# Patient Record
Sex: Male | Born: 1985 | Hispanic: Yes | Marital: Married | Smoking: Never smoker
Health system: Southern US, Community
[De-identification: ages and names within clinical notes are randomized; demographics above are authoritative.]

## PROBLEM LIST (undated history)

## (undated) DIAGNOSIS — N200 Calculus of kidney: Secondary | ICD-10-CM

## (undated) DIAGNOSIS — N289 Disorder of kidney and ureter, unspecified: Secondary | ICD-10-CM

## (undated) DIAGNOSIS — T7840XA Allergy, unspecified, initial encounter: Secondary | ICD-10-CM

## (undated) HISTORY — PX: OTHER SURGICAL HISTORY: SHX169

## (undated) HISTORY — DX: Allergy, unspecified, initial encounter: T78.40XA

## (undated) HISTORY — PX: COLONOSCOPY: SHX174

---

## 2012-07-26 ENCOUNTER — Ambulatory Visit (INDEPENDENT_AMBULATORY_CARE_PROVIDER_SITE_OTHER): Payer: BC Managed Care – PPO | Admitting: Internal Medicine

## 2012-07-26 ENCOUNTER — Ambulatory Visit: Payer: BC Managed Care – PPO

## 2012-07-26 VITALS — BP 137/82 | HR 66 | Temp 98.8°F | Resp 17 | Ht 67.0 in | Wt 203.0 lb

## 2012-07-26 DIAGNOSIS — R0789 Other chest pain: Secondary | ICD-10-CM

## 2012-07-26 DIAGNOSIS — R071 Chest pain on breathing: Secondary | ICD-10-CM

## 2012-07-26 MED ORDER — CYCLOBENZAPRINE HCL 10 MG PO TABS: 10.0000 mg | ORAL_TABLET | Freq: Three times a day (TID) | ORAL | Status: DC | PRN

## 2012-07-26 NOTE — Progress Notes (Signed)
Thomas Moran is a 27 y.o. male who presents to [facility] today for left chest wall pain starting 3 days ago. Patient has pain in the left side of his chest wall radiating to the mid chest. The pain is nonexertional. He notes some palpitations but denies any shortness of breath syncope or lightheadedness. He does not recall any injury or previous episodes of chest pain. He feels well otherwise with no fevers chills nausea vomiting or diarrhea.  He works as a Scientist, forensic.     PMH: Reviewed otherwise healthy History  Substance Use Topics  . Smoking status: Never Smoker   . Smokeless tobacco: Not on file  . Alcohol Use: No   ROS as above  Medications reviewed. Current Outpatient Prescriptions  Medication Sig Dispense Refill  . guaiFENesin (MUCINEX) 600 MG 12 hr tablet Take 1,200 mg by mouth 2 (two) times daily.      . Loratadine (CLARITIN) 10 MG CAPS Take by mouth.       No current facility-administered medications for this visit.    Exam:  BP 137/82  Pulse 66  Temp(Src) 98.8 F (37.1 C) (Oral)  Resp 17  Ht 5\' 7"  (1.702 m)  Wt 203 lb (92.08 kg)  BMI 31.79 kg/m2  SpO2 99% Gen: Well NAD HEENT: EOMI,  MMM Lungs: CTABL Nl WOB Chest wall: Mildly tender insertion of the pectoralis onto the left humerus.  Nontender over chest wall.   Heart: RRR no MRG Abd: NABS, NT, ND Exts: Non edematous BL  LE, warm and well perfused.  Left shoulder: Normal range of motion normal strength negative impingement testing  12-lead EKG: Normal sinus rhythm. No ST segment abnormality.  Two-view chest x-ray: Normal-appearing no acute findings No results found for this or any previous visit (from the past 72 hour(s)).  Assessment and Plan: 27 y.o. male with left chest wall pain. 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  EKG and chest x-ray are within normal limits as are vital signs.  Plan: Flexeril, NSAIDs, Tylenol as needed Followup in 2 weeks if not improved Followup sooner if worsening Discussed warning  signs or symptoms. Please see discharge instructions. Patient expresses understanding.

## 2012-07-26 NOTE — Patient Instructions (Addendum)
Thank you for coming in today. I think you have a muscle injury to your chest.  Take the flexeril at night,  Take tylenol and aleve for pain.  Come back in 2 weeks if not better.  If worse come back sooner.  Call or go to the emergency room if you get worse, have trouble breathing, have chest pains, or palpitations.

## 2014-11-12 ENCOUNTER — Emergency Department (HOSPITAL_COMMUNITY): Payer: BLUE CROSS/BLUE SHIELD

## 2014-11-12 ENCOUNTER — Emergency Department (HOSPITAL_COMMUNITY)
Admission: EM | Admit: 2014-11-12 | Discharge: 2014-11-12 | Disposition: A | Discharge status: Home / Self Care | Payer: BLUE CROSS/BLUE SHIELD | Attending: Emergency Medicine | Admitting: Emergency Medicine

## 2014-11-12 ENCOUNTER — Encounter (HOSPITAL_COMMUNITY): Payer: Self-pay | Admitting: *Deleted

## 2014-11-12 DIAGNOSIS — Z79899 Other long term (current) drug therapy: Secondary | ICD-10-CM | POA: Insufficient documentation

## 2014-11-12 DIAGNOSIS — N201 Calculus of ureter: Secondary | ICD-10-CM | POA: Diagnosis not present

## 2014-11-12 DIAGNOSIS — R1011 Right upper quadrant pain: Secondary | ICD-10-CM | POA: Diagnosis present

## 2014-11-12 DIAGNOSIS — R6883 Chills (without fever): Secondary | ICD-10-CM | POA: Insufficient documentation

## 2014-11-12 LAB — URINE MICROSCOPIC-ADD ON

## 2014-11-12 LAB — CBC WITH DIFFERENTIAL/PLATELET
BASOS PCT: 1 % (ref 0–1)
Basophils Absolute: 0.1 10*3/uL (ref 0.0–0.1)
Eosinophils Absolute: 0.3 10*3/uL (ref 0.0–0.7)
Eosinophils Relative: 5 % (ref 0–5)
HCT: 41.2 % (ref 39.0–52.0)
HEMOGLOBIN: 13.9 g/dL (ref 13.0–17.0)
LYMPHS PCT: 40 % (ref 12–46)
Lymphs Abs: 2.1 10*3/uL (ref 0.7–4.0)
MCH: 28.7 pg (ref 26.0–34.0)
MCHC: 33.7 g/dL (ref 30.0–36.0)
MCV: 85.1 fL (ref 78.0–100.0)
MONO ABS: 0.4 10*3/uL (ref 0.1–1.0)
Monocytes Relative: 7 % (ref 3–12)
Neutro Abs: 2.5 10*3/uL (ref 1.7–7.7)
Neutrophils Relative %: 47 % (ref 43–77)
Platelets: 231 10*3/uL (ref 150–400)
RBC: 4.84 MIL/uL (ref 4.22–5.81)
RDW: 13.4 % (ref 11.5–15.5)
WBC: 5.3 10*3/uL (ref 4.0–10.5)

## 2014-11-12 LAB — URINALYSIS, ROUTINE W REFLEX MICROSCOPIC
BILIRUBIN URINE: NEGATIVE
Glucose, UA: NEGATIVE mg/dL
Ketones, ur: NEGATIVE mg/dL
Leukocytes, UA: NEGATIVE
Nitrite: NEGATIVE
PROTEIN: NEGATIVE mg/dL
Specific Gravity, Urine: 1.022 (ref 1.005–1.030)
Urobilinogen, UA: 0.2 mg/dL (ref 0.0–1.0)
pH: 6.5 (ref 5.0–8.0)

## 2014-11-12 LAB — COMPREHENSIVE METABOLIC PANEL
ALT: 132 U/L — AB (ref 17–63)
AST: 68 U/L — AB (ref 15–41)
Albumin: 4 g/dL (ref 3.5–5.0)
Alkaline Phosphatase: 77 U/L (ref 38–126)
Anion gap: 9 (ref 5–15)
BUN: 11 mg/dL (ref 6–20)
CALCIUM: 8.7 mg/dL — AB (ref 8.9–10.3)
CO2: 22 mmol/L (ref 22–32)
Chloride: 109 mmol/L (ref 101–111)
Creatinine, Ser: 0.86 mg/dL (ref 0.61–1.24)
GFR calc non Af Amer: >60 mL/min (ref >60)
Glucose, Bld: 121 mg/dL — ABNORMAL HIGH (ref 65–99)
Potassium: 4 mmol/L (ref 3.5–5.1)
SODIUM: 140 mmol/L (ref 135–145)
Total Bilirubin: 0.5 mg/dL (ref 0.3–1.2)
Total Protein: 6.9 g/dL (ref 6.5–8.1)

## 2014-11-12 LAB — LIPASE, BLOOD: Lipase: 26 U/L (ref 22–51)

## 2014-11-12 MED ORDER — SODIUM CHLORIDE 0.9 % IV BOLUS (SEPSIS)
500.0000 mL | Freq: Once | INTRAVENOUS | Status: AC
  Administered 2014-11-12: 500 mL via INTRAVENOUS

## 2014-11-12 MED ORDER — IOHEXOL 300 MG/ML  SOLN: 25.0000 mL | INTRAMUSCULAR | Status: DC

## 2014-11-12 MED ORDER — ONDANSETRON HCL 4 MG/2ML IJ SOLN
4.0000 mg | Freq: Once | INTRAMUSCULAR | Status: AC
  Administered 2014-11-12: 4 mg via INTRAVENOUS
  Filled 2014-11-12: qty 2

## 2014-11-12 MED ORDER — KETOROLAC TROMETHAMINE 30 MG/ML IJ SOLN
30.0000 mg | Freq: Once | INTRAMUSCULAR | Status: AC
  Administered 2014-11-12: 30 mg via INTRAVENOUS
  Filled 2014-11-12: qty 1

## 2014-11-12 MED ORDER — IOHEXOL 300 MG/ML  SOLN
25.0000 mL | INTRAMUSCULAR | Status: AC
  Administered 2014-11-12: 25 mL via ORAL

## 2014-11-12 MED ORDER — FENTANYL CITRATE (PF) 100 MCG/2ML IJ SOLN
100.0000 ug | Freq: Once | INTRAMUSCULAR | Status: AC
  Administered 2014-11-12: 100 ug via INTRAVENOUS
  Filled 2014-11-12: qty 2

## 2014-11-12 MED ORDER — OXYCODONE-ACETAMINOPHEN 5-325 MG PO TABS: 1.0000 | ORAL_TABLET | Freq: Four times a day (QID) | ORAL | Status: DC | PRN

## 2014-11-12 MED ORDER — IOHEXOL 300 MG/ML  SOLN
100.0000 mL | Freq: Once | INTRAMUSCULAR | Status: AC | PRN
Start: 2014-11-12 — End: 2014-11-12
  Administered 2014-11-12: 100 mL via INTRAVENOUS

## 2014-11-12 NOTE — Discharge Instructions (Signed)
Clculos renales (Kidney Stones) Los clculos renales (urolitiasis) son masas slidas que se forman en el interior de los riones. El dolor intenso es causado por el movimiento de la piedra a travs del tracto urinario. Cuando la piedra se mueve, el urter hace un espasmo alrededor de la misma. El clculo generalmente se elimina con la orina.  CAUSAS   Un trastorno que hace que ciertas glndulas del cuello produzcan demasiada hormona paratiroidea (hiperparatiroidismo primario).  Una acumulacin de cristales de cido rico, similar a la gota en las articulaciones.  Estrechamiento (constriccin) del urter.  Obstruccin en el rin presente al nacer (obstruccin congnita).  Cirugas previas del rin o los urteres.  Numerosas infecciones renales. SNTOMAS   Ganas de vomitar (nuseas).  Devolver la comida (vomitar).  Sangre en la orina (hematuria).  Dolor que generalmente se expande (irradia) hacia la ingle.  Ganas de orinar con frecuencia o de manera urgente. DIAGNSTICO   Historia clnica y examen fsico.  Anlisis de sangre y orina.  Tomografa computada.  En algunos casos se realiza un examen del interior de la vejiga (citoscopa). TRATAMIENTO   Observacin.  Aumentar la ingesta de lquidos.  Litotricia extracorprea con ondas de choque: es un procedimiento no invasivo que utiliza ondas de choque para romper los clculos renales.  Ser necesaria la ciruga si tiene dolor muy intenso o la obstruccin persiste. Hay varios procedimientos quirrgicos. La mayora de los procedimientos se realizan con el uso de pequeos instrumentos. Slo es necesario realizar pequeas incisiones para acomodar estos instrumentos, por lo tanto el tiempo de recuperacin es mnimo. El tamao, la ubicacin y la composicin qumica de los clculos son variables importantes que determinarn la eleccin correcta de tratamiento para su caso. Comunquese con su mdico para comprender mejor su  situacin, de modo que pueda minimizar los riesgos de lesiones para usted y su rin.  INSTRUCCIONES PARA EL CUIDADO EN EL HOGAR   Beba gran cantidad de lquido para mantener la orina de tono claro o color amarillo plido. Esto ayudar a eliminar las piedras o los fragmentos.  Cuele la orina con el colador que le han provisto. Guarde todas las partculas y piedras para que las vea el profesional que lo asiste. Puede ser tan pequea como un grano de sal. Es muy importante usar el colador cada vez que orine. La recoleccin de piedras permitir al mdico analizar y verificar que efectivamente ha eliminado una piedra. El anlisis de la piedra con frecuencia permitir identificar qu puede hacer para reducir la incidencia de las recurrencias.  Slo tome medicamentos de venta libre o recetados para calmar el dolor, el malestar o bajar la fiebre, segn las indicaciones de su mdico.  Cumpla con las citas de seguimiento tal como le indic el profesional que lo asiste.  Si se lo indica, hgase radiografas. La ausencia de dolor no siempre significa que las piedras se han eliminado. Puede ser que simplemente hayan dejado de moverse. Si el paso de orina permanece completamente obstruido, puede causar prdida de la funcin renal o simplemente la destruccin del rin. Es su responsabilidad completar el seguimiento y las radiografas. Las ecografas del rin pueden mostrar una obstruccin y el estado del rin. Las ecografas no se asocian con la radiacin y pueden realizarse fcilmente en cuestin de minutos. SOLICITE ATENCIN MDICA SI:  Siente dolor que no responde a los analgsicos que le recetaron. SOLICITE ATENCIN MDICA DE INMEDIATO SI:   No puede controlar el dolor con los medicamentos que le han recetado.  Siente escalofros   o fiebre.  La gravedad o la intensidad del dolor aumenta durante 18 horas y no se alivia con los analgsicos.  Presenta un nuevo episodio de dolor abdominal.  Sufre mareos  o se desmaya.  No puede orinar. ASEGRESE DE QUE:   Comprende estas instrucciones.  Controlar su afeccin.  Recibir ayuda de inmediato si no mejora o si empeora. Document Released: 05/18/2005 Document Revised: 01/18/2013 [employer] Patient Information 2015 [employer]. This information is not intended to replace advice given to you by your health care provider. Make sure you discuss any questions you have with your health care provider.  

## 2014-11-12 NOTE — ED Notes (Addendum)
Pt states that he has rt upper quadrant pain this morning. Pt reports feeling like it was "gas" then began hurting worse. Pt reports nausea as well.

## 2014-11-12 NOTE — ED Provider Notes (Signed)
CSN: [id number]     Arrival date & time 11/12/14  0708 History   First MD Initiated Contact with Patient 11/12/14 [phone]     Chief Complaint  Patient presents with  . Abdominal Pain     (Consider location/radiation/quality/duration/timing/severity/associated sxs/prior Treatment) Patient is a 29 y.o. male presenting with abdominal pain. The history is provided by the patient.  Abdominal Pain Pain location:  RUQ, R flank and RLQ Associated symptoms: chills and nausea   Associated symptoms: no chest pain, no diarrhea, no shortness of breath and no vomiting    patient with right-sided abdominal pain. Began this morning. It is severe. He has had bowel movements twice. States they were normal. Has had some nausea. He states it was due to the pain. No dysuria. No fevers but states he has had some chills. Has not had pain like this before. The pain is constant and somewhat crampy. Pain is dull. He does not smoke. No rash. No trauma.  Past Medical History  Diagnosis Date  . Allergy    History reviewed. No pertinent past surgical history. No family history on file. History  Substance Use Topics  . Smoking status: Never Smoker   . Smokeless tobacco: Not on file  . Alcohol Use: No    Review of Systems  Constitutional: Positive for chills. Negative for activity change and appetite change.  Eyes: Negative for pain.  Respiratory: Negative for chest tightness and shortness of breath.   Cardiovascular: Negative for chest pain and leg swelling.  Gastrointestinal: Positive for nausea and abdominal pain. Negative for vomiting and diarrhea.  Genitourinary: Negative for flank pain.  Musculoskeletal: Negative for back pain and neck stiffness.  Skin: Negative for rash.  Neurological: Negative for weakness, numbness and headaches.  Psychiatric/Behavioral: Negative for behavioral problems.      Allergies  Review of patient's allergies indicates no known allergies.  Home Medications   Prior to  Admission medications   Medication Sig Start Date End Date Taking? Authorizing Provider  cetirizine (ZYRTEC) 10 MG tablet Take 10 mg by mouth daily.   Yes Historical Provider, MD  oxymetazoline (AFRIN) 0.05 % nasal spray Place 1 spray into both nostrils 2 (two) times daily.   Yes Historical Provider, MD  oxyCODONE-acetaminophen (PERCOCET/ROXICET) 5-325 MG per tablet Take 1-2 tablets by mouth every 6 (six) hours as needed. 11/12/14   Benjiman Core, MD   BP 113/51 mmHg  Pulse 54  Temp(Src) 97.7 F (36.5 C) (Oral)  Resp 15  Ht  (1.626 m)  Wt 210 lb (95.255 kg)  BMI 36.03 kg/m2  SpO2 99% Physical Exam  Constitutional: He appears well-developed.  Patient appears uncomfortable  Cardiovascular: Normal rate and regular rhythm.   Pulmonary/Chest: Effort normal. He exhibits no tenderness.  Abdominal: There is tenderness.  Right upper quadrant And right flank tenderness. No hernias. No masses. No guarding.  Genitourinary:  No CVA tenderness  Musculoskeletal: Normal range of motion.  Neurological: He is alert.  Skin: Skin is warm.    ED Course  Procedures (including critical care time) Labs Review Labs Reviewed  COMPREHENSIVE METABOLIC PANEL - Abnormal; Notable for the following:    Glucose, Bld 121 (*)    Calcium 8.7 (*)    AST 68 (*)    ALT 132 (*)    All other components within normal limits  URINALYSIS, ROUTINE W REFLEX MICROSCOPIC (NOT AT [facility]) - Abnormal; Notable for the following:    Hgb urine dipstick TRACE (*)    All other components  within normal limits  CBC WITH DIFFERENTIAL/PLATELET  LIPASE, BLOOD  URINE MICROSCOPIC-ADD ON    Imaging Review No results found.   EKG Interpretation   Date/Time:  Monday November 12 2014 07:24:09 EDT Ventricular Rate:  70 PR Interval:  162 QRS Duration: 86 QT Interval:  407 QTC Calculation: 439 R Axis:   45 Text Interpretation:  Sinus arrhythmia ED PHYSICIAN INTERPRETATION  AVAILABLE IN [facility] Confirmed by TEST,  Record ([id number]) on  11/13/2014 7:24:15 AM      MDM   Final diagnoses:  Ureteral stone    Patient with right-sided abdominal pain. Tender on right abdomen. No urinary symptoms but has had bowel symptoms. CT scan shows ureteral stone. No infection. Will discharge home. Pain has been controlled    Benjiman Core, MD 11/15/14 [phone]

## 2015-12-07 IMAGING — CT CT ABD-PELV W/ CM
2 of 4 series · 16 of 46 positions shown, 18 images · IV contrast (APPLIED)
Comparison: None.

CLINICAL DATA: Right-sided abdominal pain.  Nausea

EXAM:
CT ABDOMEN AND PELVIS WITH CONTRAST
TECHNIQUE: Multidetector CT imaging of the abdomen and pelvis was performed
using the standard protocol following bolus administration of
intravenous contrast.
CONTRAST:  100mL OMNIPAQUE IOHEXOL 300 MG/ML  SOLN

[Series 2: abd/ pelvis 5.0 i30f 1 · axial · 0.79mm/px · z∈[+723,+1178]mm · 13 of 101 slices shown, 15 images]
[im 5/101  soft-tissue]
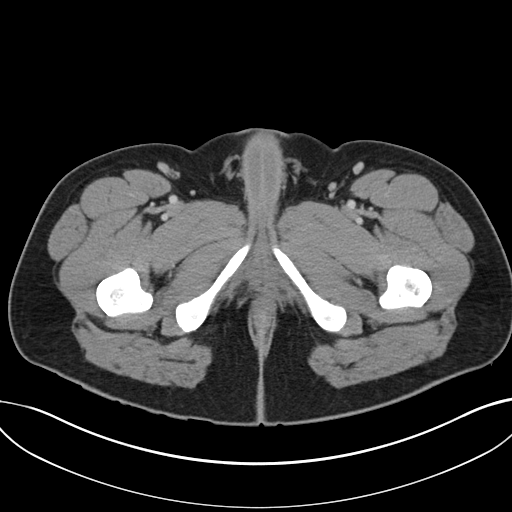
[im 5/101  bone]
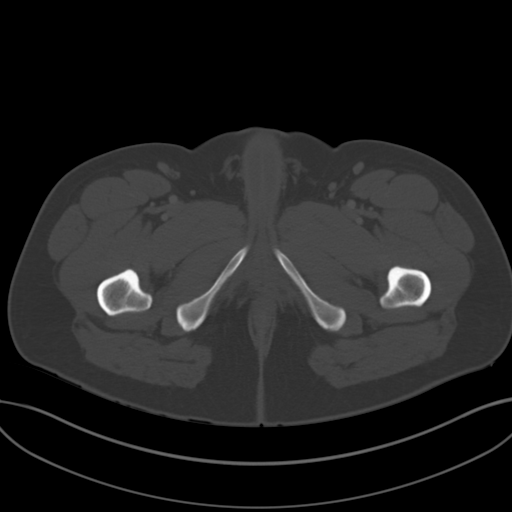
[im 13/101  soft-tissue]
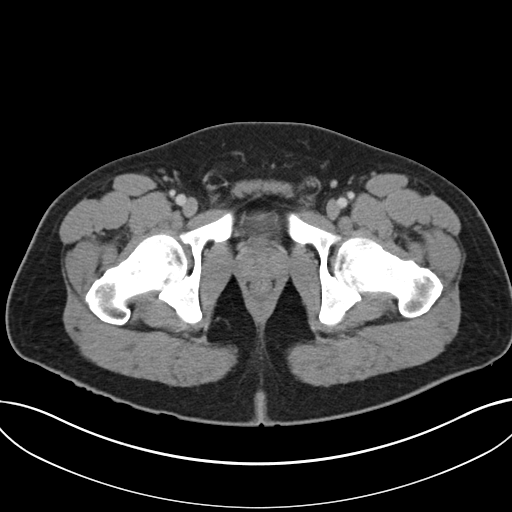
[im 21/101  soft-tissue]
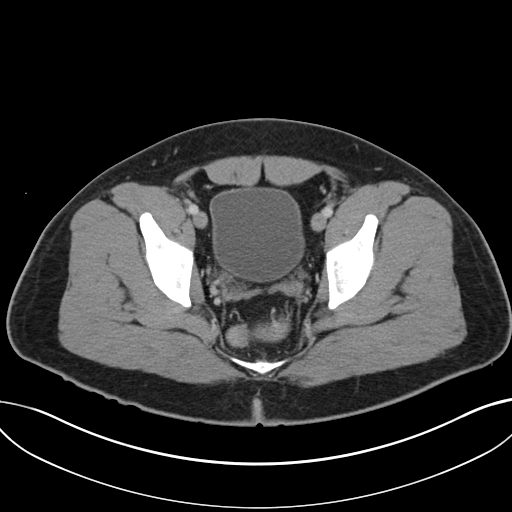
[im 30/101  soft-tissue]
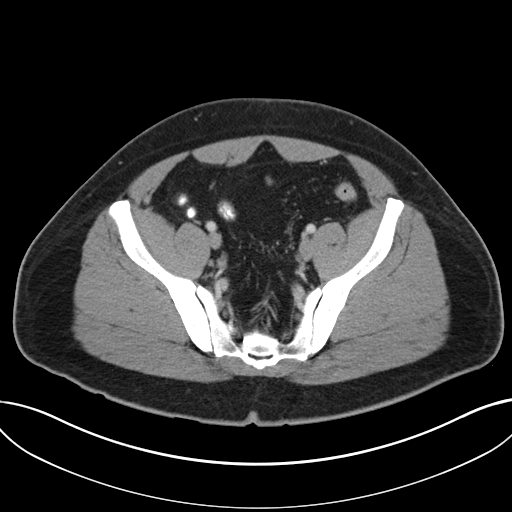
[im 34/101  soft-tissue]
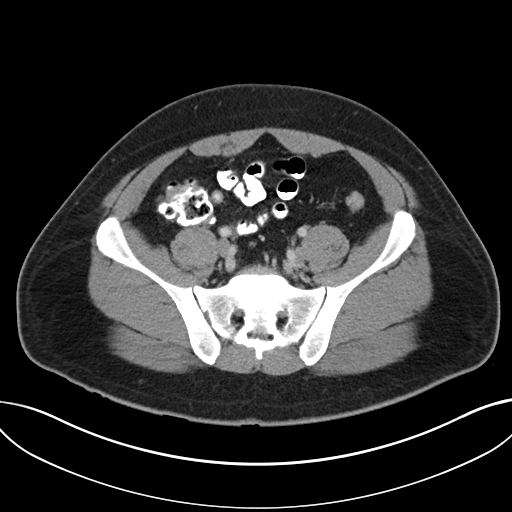
[im 42/101  soft-tissue]
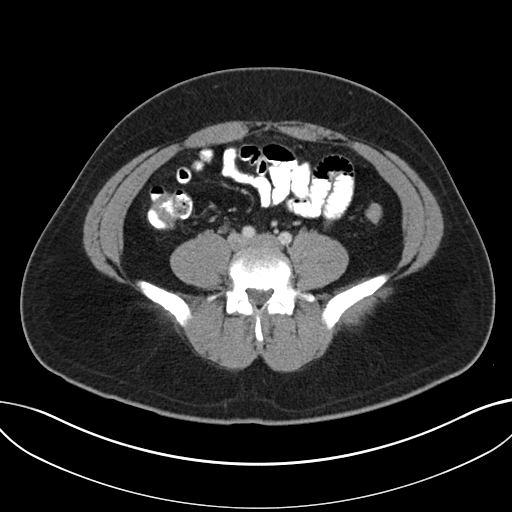
[im 51/101  soft-tissue]
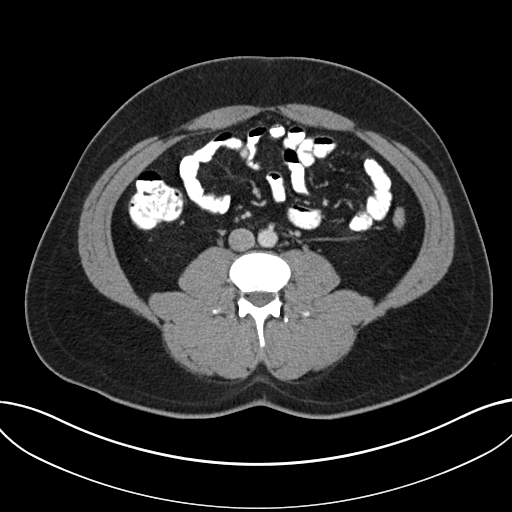
[im 59/101  soft-tissue]
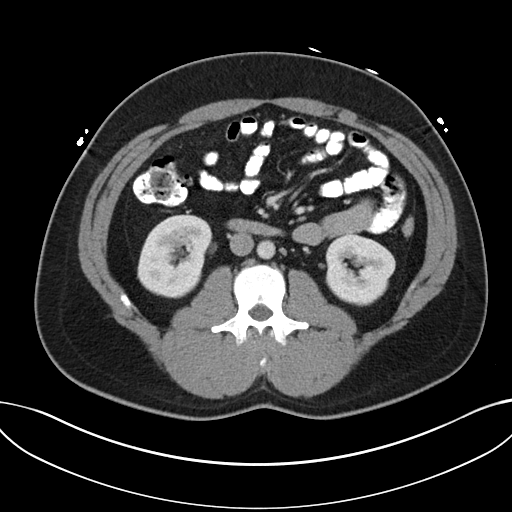
[im 67/101  soft-tissue]
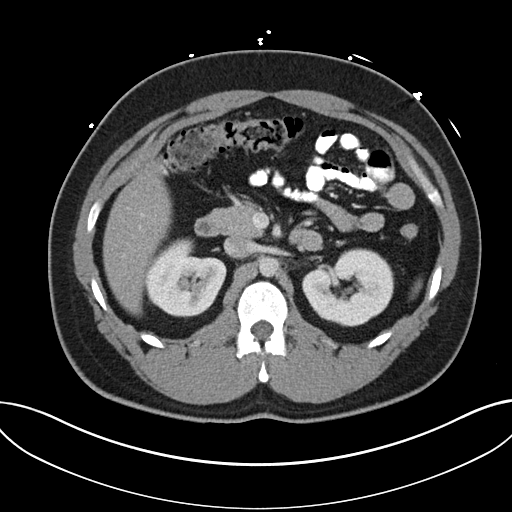
[im 67/101  bone]
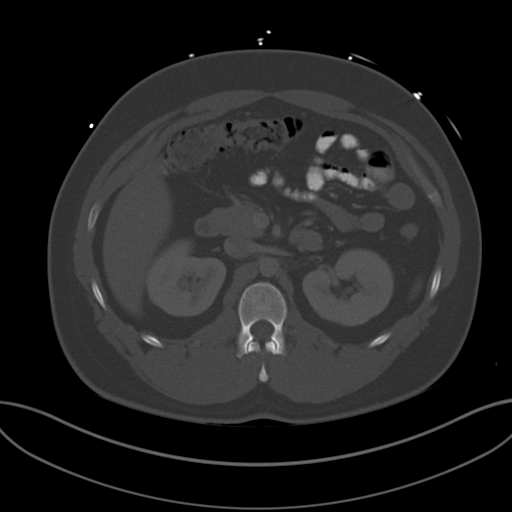
[im 71/101  soft-tissue]
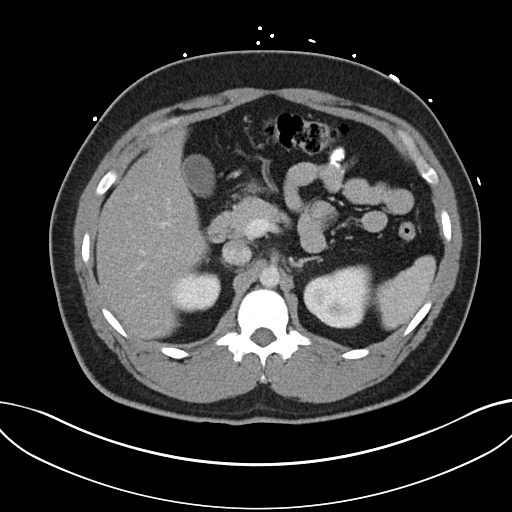
[im 80/101  soft-tissue]
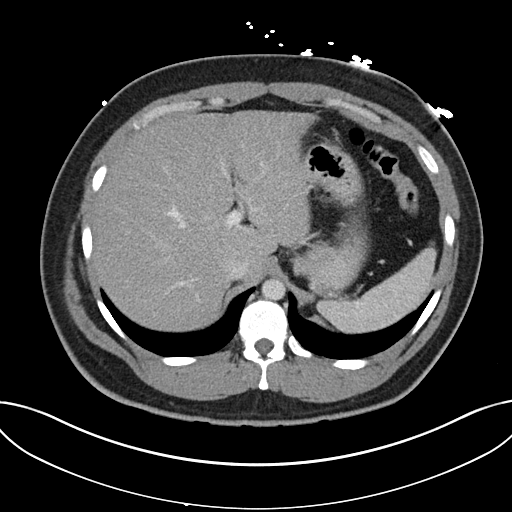
[im 88/101  soft-tissue]
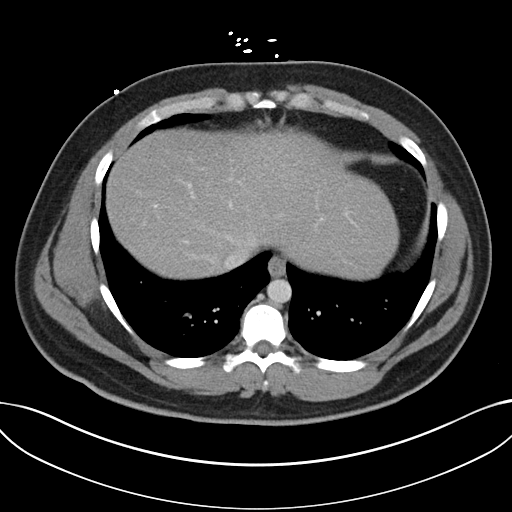
[im 96/101  soft-tissue]
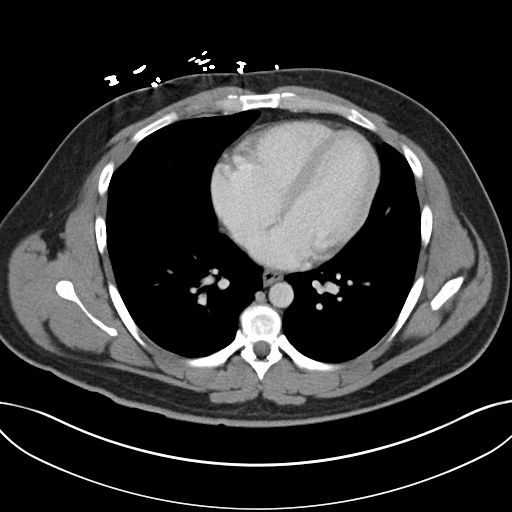

[Series 5: coronal soft tissue · coronal · 0.89mm/px · 3 of 101 slices shown]
[im 34/101  soft-tissue]
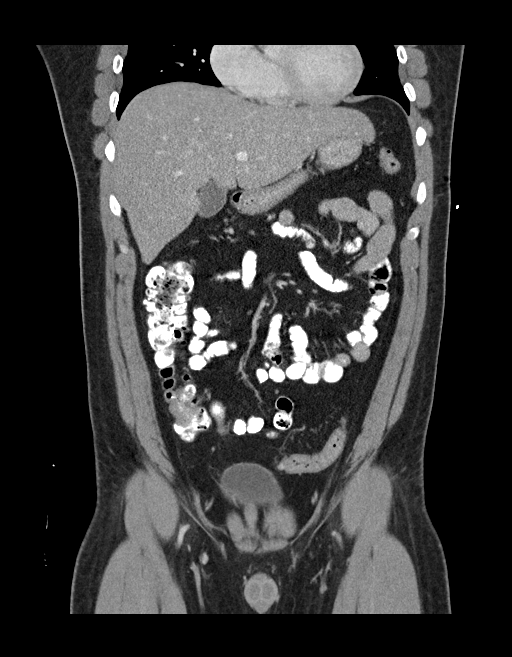
[im 45/101  soft-tissue]
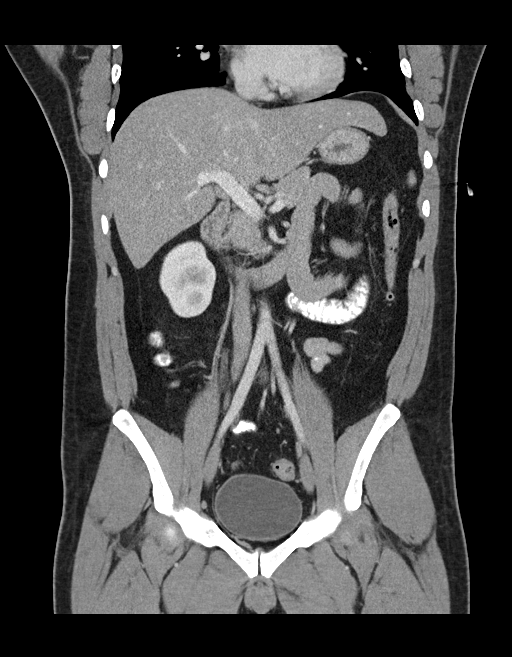
[im 56/101  soft-tissue]
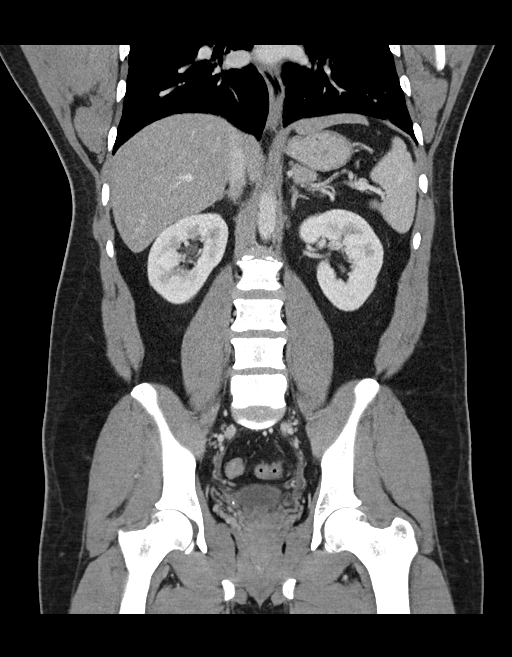

[16 of 46 positions shown; findings below may reference images not displayed]

FINDINGS: BODY WALL: No contributory findings.

LOWER CHEST: No contributory findings. Calcified granuloma noted in
the left lower lobe.

ABDOMEN/PELVIS:

Liver: No focal abnormality.  Possible hepatic steatosis.

Biliary: No evidence of biliary obstruction or stone.

Pancreas: Unremarkable.

Spleen: Unremarkable.

Adrenals: Unremarkable.

Kidneys and ureters: 2 mm stone at the right ureteral vesicular
junction with minimal hydroureter. No additional urolithiasis.

Bladder: Unremarkable.

Reproductive: No pathologic findings.

Bowel: No obstruction. No appendicitis. Colonic diverticulosis,
extensive for age in the sigmoid region.

Retroperitoneum: No mass or adenopathy.

Peritoneum: No ascites or pneumoperitoneum.

Vascular: No acute abnormality.

OSSEOUS: No acute abnormalities.
IMPRESSION: 1. 2 mm stone at the right ureteral vesicular junction,
nonobstructive.
2. Extensive colonic diverticulosis.

## 2023-09-06 ENCOUNTER — Encounter (HOSPITAL_COMMUNITY): Payer: Self-pay

## 2023-09-06 ENCOUNTER — Ambulatory Visit (HOSPITAL_COMMUNITY)
Admission: EM | Admit: 2023-09-06 | Discharge: 2023-09-06 | Disposition: A | Discharge status: Home / Self Care | Attending: Physician Assistant | Admitting: Physician Assistant

## 2023-09-06 DIAGNOSIS — R300 Dysuria: Secondary | ICD-10-CM

## 2023-09-06 LAB — POCT URINALYSIS DIP (MANUAL ENTRY)
Bilirubin, UA: NEGATIVE
Glucose, UA: NEGATIVE mg/dL
Ketones, POC UA: NEGATIVE mg/dL
Nitrite, UA: NEGATIVE
Spec Grav, UA: 1.015
Urobilinogen, UA: 0.2 U/dL
pH, UA: 6

## 2023-09-06 MED ORDER — DOXYCYCLINE HYCLATE 100 MG PO CAPS: 100.0000 mg | ORAL_CAPSULE | Freq: Two times a day (BID) | ORAL | 0 refills | Status: DC

## 2023-09-06 NOTE — ED Triage Notes (Signed)
 Patient reports that he has had dysuria x 3 1/2 weeks. Patient states he took Azo which helped some, but states that when he urinates he has bubbling from the penis.  Patient denies any penile discharge.

## 2023-09-06 NOTE — Discharge Instructions (Addendum)
 Return if any problems.

## 2023-09-06 NOTE — ED Provider Notes (Signed)
 [facility]    CSN: [id number] Arrival date & time: 09/06/23  4132      History   Chief Complaint Chief Complaint  Patient presents with   Dysuria    HPI Thomas Moran is a 38 y.o. male.   Patient complains of discomfort when he urinates.  Patient reports that he has a stabbing pain.  Patient reports he is also noticed bubbles in his urine.  Patient denies any fever or chills he has not had any abdominal pain.  Patient denies any penile discharge he denies any risk of anything sexually transmitted.  He reports his partner has not had any symptoms.  Patient denies any nausea or vomiting.  Patient denies any rash or lesions.  The history is provided by the patient. No language interpreter was used.  Dysuria Presenting symptoms: dysuria     Past Medical History:  Diagnosis Date   Allergy     Patient Active Problem List   Diagnosis Date Noted   Chest wall pain 07/26/2012    Past Surgical History:  Procedure Laterality Date   arm surgery Right    COLONOSCOPY         Home Medications    Prior to Admission medications   Medication Sig Start Date End Date Taking? Authorizing Provider  cetirizine (ZYRTEC) 10 MG tablet Take 10 mg by mouth daily.    [provider]  oxyCODONE-acetaminophen (PERCOCET/ROXICET) 5-325 MG per tablet Take 1-2 tablets by mouth every 6 (six) hours as needed. 11/12/14   Benjiman Core, MD  oxymetazoline (AFRIN) 0.05 % nasal spray Place 1 spray into both nostrils 2 (two) times daily.    [provider]    Family History Family History  Problem Relation Age of Onset   Diabetes Mother     Social History Social History   Tobacco Use   Smoking status: Never  Vaping Use   Vaping status: Never Used  Substance Use Topics   Alcohol use: Yes   Drug use: No     Allergies   Patient has no known allergies.   Review of Systems Review of Systems  Genitourinary:  Positive for dysuria.  All other systems  reviewed and are negative.    Physical Exam Triage Vital Signs ED Triage Vitals [09/06/23 0850]  Encounter Vitals Group     BP (!) 154/102     Systolic BP Percentile      Diastolic BP Percentile      Pulse Rate 93     Resp 16     Temp 98 F (36.7 C)     Temp Source Oral     SpO2 94 %     Weight      Height      Head Circumference      Peak Flow      Pain Score 4     Pain Loc      Pain Education      Exclude from Growth Chart    No data found.  Updated Vital Signs BP (!) 154/102 (BP Location: Left Arm)   Pulse 93   Temp 98 F (36.7 C) (Oral)   Resp 16   SpO2 94%   Visual Acuity Right Eye Distance:   Left Eye Distance:   Bilateral Distance:    Right Eye Near:   Left Eye Near:    Bilateral Near:     Physical Exam Vitals and nursing note reviewed.  Constitutional:      Appearance:  He is well-developed.  HENT:     Head: Normocephalic.  Pulmonary:     Effort: Pulmonary effort is normal.  Abdominal:     General: There is no distension.  Musculoskeletal:        General: Normal range of motion.     Cervical back: Normal range of motion.  Neurological:     Mental Status: He is alert and oriented to person, place, and time.      UC Treatments / Results  Labs (all labs ordered are listed, but only abnormal results are displayed) Labs Reviewed  POCT URINALYSIS DIP (MANUAL ENTRY) - Abnormal; Notable for the following components:      Result Value   Blood, UA small (*)    Protein Ur, POC trace (*)    Leukocytes, UA Small (1+) (*)    All other components within normal limits  CYTOLOGY, (ORAL, ANAL, URETHRAL) ANCILLARY ONLY    EKG   Radiology No results found.  Procedures Procedures (including critical care time)  Medications Ordered in UC Medications - No data to display  Initial Impression / Assessment and Plan / UC Course  I have reviewed the triage vital signs and the nursing notes.  Pertinent labs & imaging results that were available  during my care of the patient were reviewed by me and considered in my medical decision making (see chart for details).     UA shows small red blood cells trace of protein small leukocytes.  I will treat the patient with prescription for doxycycline.  Culture for gonorrhea chlamydia and trichomonas are pending patient is discharged in stable condition to return if any problems. Final Clinical Impressions(s) / UC Diagnoses   Final diagnoses:  Dysuria     Discharge Instructions      Return if any problems.      ED Prescriptions   None    PDMP not reviewed this encounter. An After Visit Summary was printed and given to the patient.       Elson Areas, [place] 09/06/23 [phone]

## 2023-09-07 LAB — CYTOLOGY, (ORAL, ANAL, URETHRAL) ANCILLARY ONLY
Chlamydia: NEGATIVE
Comment: NEGATIVE
Comment: NEGATIVE
Comment: NORMAL
Neisseria Gonorrhea: NEGATIVE
Trichomonas: NEGATIVE

## 2024-02-06 ENCOUNTER — Ambulatory Visit (HOSPITAL_COMMUNITY): Payer: Self-pay | Admitting: Physician Assistant

## 2024-02-06 ENCOUNTER — Ambulatory Visit (INDEPENDENT_AMBULATORY_CARE_PROVIDER_SITE_OTHER)

## 2024-02-06 ENCOUNTER — Encounter (HOSPITAL_COMMUNITY): Payer: Self-pay

## 2024-02-06 ENCOUNTER — Ambulatory Visit (HOSPITAL_COMMUNITY)
Admission: EM | Admit: 2024-02-06 | Discharge: 2024-02-06 | Disposition: A | Discharge status: Home / Self Care | Attending: Physician Assistant | Admitting: Physician Assistant

## 2024-02-06 DIAGNOSIS — R1032 Left lower quadrant pain: Secondary | ICD-10-CM | POA: Diagnosis not present

## 2024-02-06 DIAGNOSIS — R109 Unspecified abdominal pain: Secondary | ICD-10-CM | POA: Diagnosis not present

## 2024-02-06 DIAGNOSIS — B370 Candidal stomatitis: Secondary | ICD-10-CM | POA: Diagnosis present

## 2024-02-06 DIAGNOSIS — N3001 Acute cystitis with hematuria: Secondary | ICD-10-CM | POA: Diagnosis present

## 2024-02-06 HISTORY — DX: Calculus of kidney: N20.0

## 2024-02-06 HISTORY — DX: Disorder of kidney and ureter, unspecified: N28.9

## 2024-02-06 LAB — POCT URINALYSIS DIP (MANUAL ENTRY)
Bilirubin, UA: NEGATIVE
Glucose, UA: NEGATIVE mg/dL
Nitrite, UA: NEGATIVE
Protein Ur, POC: 30 mg/dL — AB
Spec Grav, UA: 1.015 (ref 1.010–1.025)
Urobilinogen, UA: 1 U/dL
pH, UA: 6 (ref 5.0–8.0)

## 2024-02-06 LAB — CBC WITH DIFFERENTIAL/PLATELET
Abs Immature Granulocytes: 0.06 K/uL (ref 0.00–0.07)
Basophils Absolute: 0.1 K/uL (ref 0.0–0.1)
Basophils Relative: 1 %
Eosinophils Absolute: 0.1 K/uL (ref 0.0–0.5)
Eosinophils Relative: 2 %
HCT: 39.1 % (ref 39.0–52.0)
Hemoglobin: 12.8 g/dL — ABNORMAL LOW (ref 13.0–17.0)
Immature Granulocytes: 1 %
Lymphocytes Relative: 20 %
Lymphs Abs: 1.6 K/uL (ref 0.7–4.0)
MCH: 27.5 pg (ref 26.0–34.0)
MCHC: 32.7 g/dL (ref 30.0–36.0)
MCV: 83.9 fL (ref 80.0–100.0)
Monocytes Absolute: 0.7 K/uL (ref 0.1–1.0)
Monocytes Relative: 8 %
Neutro Abs: 5.5 K/uL (ref 1.7–7.7)
Neutrophils Relative %: 68 %
Platelets: 344 K/uL (ref 150–400)
RBC: 4.66 MIL/uL (ref 4.22–5.81)
RDW: 14.1 % (ref 11.5–15.5)
WBC: 8 K/uL (ref 4.0–10.5)
nRBC: 0 % (ref 0.0–0.2)

## 2024-02-06 LAB — COMPREHENSIVE METABOLIC PANEL WITH GFR
ALT: 109 U/L — ABNORMAL HIGH (ref 0–44)
AST: 100 U/L — ABNORMAL HIGH (ref 15–41)
Albumin: 3.3 g/dL — ABNORMAL LOW (ref 3.5–5.0)
Alkaline Phosphatase: 77 U/L (ref 38–126)
Anion gap: 12 (ref 5–15)
BUN: 12 mg/dL (ref 6–20)
CO2: 23 mmol/L (ref 22–32)
Calcium: 8.7 mg/dL — ABNORMAL LOW (ref 8.9–10.3)
Chloride: 102 mmol/L (ref 98–111)
Creatinine, Ser: 0.89 mg/dL (ref 0.61–1.24)
GFR, Estimated: >60 mL/min (ref >60)
Glucose, Bld: 95 mg/dL (ref 70–99)
Potassium: 3.8 mmol/L (ref 3.5–5.1)
Sodium: 137 mmol/L (ref 135–145)
Total Bilirubin: 0.8 mg/dL (ref 0.0–1.2)
Total Protein: 7.9 g/dL (ref 6.5–8.1)

## 2024-02-06 MED ORDER — CEFTRIAXONE SODIUM 1 G IJ SOLR
1.0000 g | Freq: Once | INTRAMUSCULAR | Status: AC
  Administered 2024-02-06: 1 g via INTRAMUSCULAR

## 2024-02-06 MED ORDER — NYSTATIN 100000 UNIT/ML MT SUSP: 500000.0000 [IU] | Freq: Four times a day (QID) | OROMUCOSAL | 0 refills | Status: DC

## 2024-02-06 MED ORDER — CEFTRIAXONE SODIUM 1 G IJ SOLR
INTRAMUSCULAR | Status: AC
  Filled 2024-02-06: qty 10

## 2024-02-06 MED ORDER — LIDOCAINE HCL (PF) 1 % IJ SOLN
INTRAMUSCULAR | Status: AC
  Filled 2024-02-06: qty 2

## 2024-02-06 MED ORDER — SULFAMETHOXAZOLE-TRIMETHOPRIM 800-160 MG PO TABS
1.0000 | ORAL_TABLET | Freq: Two times a day (BID) | ORAL | 0 refills | Status: DC
Start: 2024-02-06 — End: 2024-02-14

## 2024-02-06 NOTE — Discharge Instructions (Signed)
 We are treating you for a urinary tract infection.  Start Bactrim  DS twice daily for 10 days.  If you develop any rash or lesions stop medication and be seen immediately.  We gave you an injection of antibiotics today.  I will contact you if your blood work is abnormal.  I would like you to follow-up with urology; call to schedule an appointment.  If you have any fever, increasing pain, difficulty passing urine, nausea/vomiting you need to go to the emergency room.  I believe that your symptoms are related to thrush.  Swish/gargle nystatin  4 times daily.  If this is not going away within a few days or if anything worsens you need to be seen immediately.

## 2024-02-06 NOTE — ED Triage Notes (Signed)
 Patient reports that he began h having chills, headache, nasal congestion and feeling like his body is burning. Patient states he has no appetite. Patient states that when he urinates he has  gas like a fart and states the urine is dark and has debris like tea leaves. And left mid back pain.  Patient also reports that he has a white coating on his tongue and and the roof of his mouth is burning since yesterday. Patient reports dizziness x 2 days.  Patient states he has been taking Dayquil and daily BC powders.

## 2024-02-06 NOTE — ED Provider Notes (Signed)
 [facility]    CSN: [id number] Arrival date & time: 02/06/24  1144      History   Chief Complaint Chief Complaint  Patient presents with   Chills   Headache   Back Pain   Generalized Body Aches   urine issue   Nasal Congestion    HPI Thomas Moran is a 38 y.o. male.   Patient presents today with several concerns.  He reports that 6 days ago he developed a mild sore throat and bodyaches.  He assumed that he had some mild illness and started taking BC powders and DayQuil.  He then developed left flank/back pain and noticed that his urine was dark with associated debris.  He has started to have dysuria as well as frequency but denies any fever, nausea, vomiting, diarrhea, constipation.  He reports his last bowel movement was yesterday and was smaller than normal.  He denies any recent dietary changes.  He does have a history of nephrolithiasis in 2016 but has never seen a urologist or had any kind of urogenital procedure.  He denies any penile discharge and has no concern for STI; declined testing today.  He denies any history of diabetes and does not take SGLT2 inhibitor.  Denies any recent antibiotics.  Denies any recent catheterization.  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  He has noticed that his urine is darker in color but he attributes this to decreased oral intake as he has a burning/sore sensation in his mouth.  He has never had thrush before.  Denies any recent medication changes or antibiotics and denies history of immunosuppression including HIV or diabetes.    Past Medical History:  Diagnosis Date   Allergy    Kidney stones    Renal disorder     Patient Active Problem List   Diagnosis Date Noted   Chest wall pain 07/26/2012    Past Surgical History:  Procedure Laterality Date   arm surgery Right    COLONOSCOPY         Home Medications     Prior to Admission medications   Medication Sig Start Date End Date Taking? Authorizing Provider  nystatin  (MYCOSTATIN ) 100000 UNIT/ML suspension Take 5 mLs (500,000 Units total) by mouth 4 (four) times daily. 02/06/24  Yes Brigido Mera K, PA-C  sulfamethoxazole -trimethoprim  (BACTRIM  DS) 800-160 MG tablet Take 1 tablet by mouth 2 (two) times daily for 10 days. 02/06/24 02/16/24 Yes Ashleyanne Hemmingway K, PA-C  cetirizine (ZYRTEC) 10 MG tablet Take 10 mg by mouth daily.    [provider]  oxyCODONE -acetaminophen  (PERCOCET/ROXICET) 5-325 MG per tablet Take 1-2 tablets by mouth every 6 (six) hours as needed. 11/12/14   Patsey Lot, MD  oxymetazoline (AFRIN) 0.05 % nasal spray Place 1 spray into both nostrils 2 (two) times daily.    [provider]    Family History Family History  Problem Relation Age of Onset   Diabetes Mother     Social History Social History   Tobacco Use   Smoking status: Never  Vaping Use   Vaping status: Never Used  Substance Use Topics   Alcohol use: Yes   Drug use: No     Allergies   Patient has no known allergies.   Review of Systems Review of Systems  Constitutional:  Positive for activity change and chills. Negative for appetite change, fatigue  and fever.  HENT:  Positive for sore throat. Negative for congestion.   Respiratory:  Negative for shortness of breath.   Cardiovascular:  Negative for chest pain.  Gastrointestinal:  Negative for abdominal pain, diarrhea, nausea and vomiting.  Genitourinary:  Positive for dysuria, flank pain and frequency. Negative for hematuria and urgency.  Musculoskeletal:  Positive for back pain. Negative for arthralgias and myalgias.     Physical Exam Triage Vital Signs ED Triage Vitals  Encounter Vitals Group     BP 02/06/24 1315 (!) 165/109     Girls Systolic BP Percentile --      Girls Diastolic BP Percentile --      Boys Systolic BP Percentile --      Boys Diastolic BP Percentile --       Pulse Rate 02/06/24 1315 (!) 103     Resp 02/06/24 1315 16     Temp 02/06/24 1315 98.2 F (36.8 C)     Temp Source 02/06/24 1315 Oral     SpO2 02/06/24 1315 97 %     Weight --      Height --      Head Circumference --      Peak Flow --      Pain Score 02/06/24 1314 6     Pain Loc --      Pain Education --      Exclude from Growth Chart --    No data found.  Updated Vital Signs BP (!) 144/102   Pulse 94   Temp 98.2 F (36.8 C) (Oral)   Resp 16   SpO2 97%   Visual Acuity Right Eye Distance:   Left Eye Distance:   Bilateral Distance:    Right Eye Near:   Left Eye Near:    Bilateral Near:     Physical Exam Vitals reviewed.  Constitutional:      General: He is awake.     Appearance: Normal appearance. He is well-developed. He is not ill-appearing.     Comments: Very pleasant male appears stated age in no acute distress sitting comfortably in exam room  HENT:     Head: Normocephalic and atraumatic.     Mouth/Throat:     Pharynx: Uvula midline. No oropharyngeal exudate or posterior oropharyngeal erythema.     Comments: Thin white coating noted hard palate and on tongue that is removable with a tongue blade. Cardiovascular:     Rate and Rhythm: Normal rate and regular rhythm.     Heart sounds: Normal heart sounds, S1 normal and S2 normal. No murmur heard. Pulmonary:     Effort: Pulmonary effort is normal.     Breath sounds: Normal breath sounds. No stridor. No wheezing, rhonchi or rales.     Comments: Clear to auscultation bilaterally Abdominal:     General: Bowel sounds are normal.     Palpations: Abdomen is soft.     Tenderness: There is no abdominal tenderness. There is no right CVA tenderness, left CVA tenderness, guarding or rebound.     Comments: Benign abdominal exam.  No tenderness palpation.  No CVA tenderness.  Musculoskeletal:     Cervical back: No tenderness or bony tenderness.     Thoracic back: No tenderness or bony tenderness.     Lumbar back: No  tenderness or bony tenderness.     Comments: Back: No pain percussion of vertebrae.  No deformity or step-off noted.  No significant tenderness palpation of paraspinal muscles.  Neurological:  Mental Status: He is alert.  Psychiatric:        Behavior: Behavior is cooperative.      UC Treatments / Results  Labs (all labs ordered are listed, but only abnormal results are displayed) Labs Reviewed  POCT URINALYSIS DIP (MANUAL ENTRY) - Abnormal; Notable for the following components:      Result Value   Color, UA straw (*)    Clarity, UA cloudy (*)    Ketones, POC UA small (15) (*)    Blood, UA large (*)    Protein Ur, POC =30 (*)    Leukocytes, UA Large (3+) (*)    All other components within normal limits  URINE CULTURE  COMPREHENSIVE METABOLIC PANEL WITH GFR  CBC WITH DIFFERENTIAL/PLATELET    EKG   Radiology DG Abdomen 1 View Result Date: 02/06/2024 EXAM: 1 VIEW XRAY OF THE ABDOMEN 02/06/2024 02:30:01 PM COMPARISON: CT of 11/12/2014. CLINICAL HISTORY: Flank pain. Patient reports that he began having chills, headache, nasal congestion and feeling like his body is burning. Patient states he has no appetite. FINDINGS: BOWEL: Nonobstructive bowel gas pattern. SOFT TISSUES: No calcific densities over the kidneys or expected course of the ureters. BONES: No acute osseous abnormality. No gross re- intraperitoneal air on this presumably supine radiographic series. IMPRESSION: 1. No acute findings. Electronically signed by: Rockey Kilts MD 02/06/2024 03:02 PM EDT RP Workstation: [id number]    Procedures Procedures (including critical care time)  Medications Ordered in UC Medications  cefTRIAXone  (ROCEPHIN ) injection 1 g (1 g Intramuscular Given 02/06/24 1431)    Initial Impression / Assessment and Plan / UC Course  I have reviewed the triage vital signs and the nursing notes.  Pertinent labs & imaging results that were available during my care of the patient were reviewed by me  and considered in my medical decision making (see chart for details).     Patient is well-appearing, afebrile, nontoxic.  He was initially mildly tachycardic but this improved after sitting quietly for several minutes.  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  He was given 1 g of Rocephin  and started on Bactrim  DS twice daily for 10 days.  We discussed that if he develops any rash or oral lesions he needs to stop the medication and be seen immediately.  No indication for dose adjustment of medication based on metabolic panel from 10/01/2019 with creatinine of 0.94 and calculated creatinine clearance of 142 mL/min.  Will send this for culture and contact him if we need to change his treatment plan based on culture results.  Basic blood work including CBC and CMP were obtained we will contact him if these are abnormal and change our treatment plan.  We discussed that is abnormal for a man to get a urinary tract infection and so recommended that he follow-up with urology and he was given the contact information for local provider.  Offered STI testing including HIV which she declined.  Low suspicion for diverticulitis despite having history of diverticulosis as he denies any bowel changes and does not have any significant tenderness on exam.  We did discuss that if anything worsens and he has a fever, severe abdominal pain, flank pain, nausea, vomiting he needs to be seen immediately.  Strict return precautions given.  All questions answered to patient satisfaction he expressed understanding and agreement to treatment plan.  Patient appears to have thrush.  He was given nystatin  to use 4  times daily for the next 7 days.  We discussed that if his symptoms are improving or if anything worsens he needs to be seen immediately close consider additional treatment such as clotrimazole torch as well as additional testing such as A1c and HIV.  Strict turn  precautions given.  All questions answered patient satisfaction.  Final Clinical Impressions(s) / UC Diagnoses   Final diagnoses:  Left flank pain  Acute cystitis with hematuria  Thrush, oral     Discharge Instructions      We are treating you for a urinary tract infection.  Start Bactrim  DS twice daily for 10 days.  If you develop any rash or lesions stop medication and be seen immediately.  We gave you an injection of antibiotics today.  I will contact you if your blood work is abnormal.  I would like you to follow-up with urology; call to schedule an appointment.  If you have any fever, increasing pain, difficulty passing urine, nausea/vomiting you need to go to the emergency room.  I believe that your symptoms are related to thrush.  Swish/gargle nystatin  4 times daily.  If this is not going away within a few days or if anything worsens you need to be seen immediately.     ED Prescriptions     Medication Sig Dispense Auth. Provider   nystatin  (MYCOSTATIN ) 100000 UNIT/ML suspension Take 5 mLs (500,000 Units total) by mouth 4 (four) times daily. 140 mL Rosezetta Balderston K, PA-C   sulfamethoxazole -trimethoprim  (BACTRIM  DS) 800-160 MG tablet Take 1 tablet by mouth 2 (two) times daily for 10 days. 20 tablet Henry Utsey K, PA-C      PDMP not reviewed this encounter.   Sherrell Rocky POUR, PA-C 02/06/24 1534

## 2024-02-08 LAB — URINE CULTURE: Culture: >=100000 — AB

## 2024-02-14 ENCOUNTER — Ambulatory Visit (INDEPENDENT_AMBULATORY_CARE_PROVIDER_SITE_OTHER): Admitting: Student in an Organized Health Care Education/Training Program

## 2024-02-14 VITALS — BP 135/94 | HR 93 | Ht 64.0 in | Wt 246.0 lb

## 2024-02-14 DIAGNOSIS — I10 Essential (primary) hypertension: Secondary | ICD-10-CM | POA: Diagnosis not present

## 2024-02-14 DIAGNOSIS — R7989 Other specified abnormal findings of blood chemistry: Secondary | ICD-10-CM | POA: Diagnosis not present

## 2024-02-14 DIAGNOSIS — Z13220 Encounter for screening for lipoid disorders: Secondary | ICD-10-CM

## 2024-02-14 DIAGNOSIS — H6122 Impacted cerumen, left ear: Secondary | ICD-10-CM

## 2024-02-14 DIAGNOSIS — Z114 Encounter for screening for human immunodeficiency virus [HIV]: Secondary | ICD-10-CM

## 2024-02-14 DIAGNOSIS — Z1159 Encounter for screening for other viral diseases: Secondary | ICD-10-CM

## 2024-02-14 DIAGNOSIS — E66812 Obesity, class 2: Secondary | ICD-10-CM | POA: Diagnosis not present

## 2024-02-14 DIAGNOSIS — N321 Vesicointestinal fistula: Secondary | ICD-10-CM | POA: Insufficient documentation

## 2024-02-14 DIAGNOSIS — Z131 Encounter for screening for diabetes mellitus: Secondary | ICD-10-CM

## 2024-02-14 DIAGNOSIS — R399 Unspecified symptoms and signs involving the genitourinary system: Secondary | ICD-10-CM | POA: Insufficient documentation

## 2024-02-14 DIAGNOSIS — H6120 Impacted cerumen, unspecified ear: Secondary | ICD-10-CM | POA: Insufficient documentation

## 2024-02-14 NOTE — Assessment & Plan Note (Signed)
 Mild degree of hepatocellular pattern elevation in ALT AST.  No significant alcohol use.  Some recent Tylenol  and aspirin use and a recent viral illness may have contributed.  Fib 4 score is low risk for advanced fibrosis, 1.04.  Will recheck liver enzymes today.  Most likely I suspect this is related to metabolic syndrome.  We talked about weight.  Will check viral hepatitis serologies and iron levels to rule out those conditions.

## 2024-02-14 NOTE — Patient Instructions (Signed)
  VISIT SUMMARY: You came in for a follow-up after being treated for a urinary tract infection and concerns about liver function. We discussed your recent symptoms, lifestyle, and overall health.  YOUR PLAN: -URINARY TRACT INFECTION: You had a urinary tract infection caused by E. coli, which is unusual in males and may be linked to holding your urine for long periods due to your job. We will check a urine sample for infection and advise you to empty your bladder every four hours.  -ELEVATED LIVER ENZYMES LIKELY SECONDARY TO HEPATIC STEATOSIS: Your elevated liver enzymes are likely due to hepatic steatosis, which is fat buildup in the liver often caused by obesity. We will order blood tests to check your liver function and for viral hepatitis, and continue to monitor your liver health.  -OBESITY: Your weight has increased significantly, likely due to a sedentary lifestyle. We discussed focusing on nutrition and calorie control, encouraging healthy meal preparation, and avoiding high-calorie foods.  -ELEVATED BLOOD PRESSURE: Your blood pressure is elevated, but we are not starting medication at this time. Weight loss and lifestyle changes may help, and we will monitor your blood pressure and consider medication if needed.  -EAR CANAL INFLAMMATION (OTITIS EXTERNA): Your ear canal is inflamed, likely from using Q-tips too much. We advise you to stop using Q-tips and will monitor for improvement.  -GENERAL HEALTH MAINTENANCE: We discussed the importance of monitoring your blood sugar and cholesterol levels and advised lifestyle modifications. We will order blood tests to check your blood sugar and cholesterol levels.  INSTRUCTIONS: Please follow up with the recommended blood tests for liver function, viral hepatitis, blood sugar, and cholesterol. Make sure to empty your bladder every four hours to prevent urinary tract infections. Monitor your blood pressure regularly and continue with the lifestyle  changes we discussed. Avoid using Q-tips in your ears and let us  know if you notice any changes or have any concerns.

## 2024-02-14 NOTE — Assessment & Plan Note (Addendum)
 One episode of an E. coli urinary tract infection, though should be noted he had pretty minimal symptoms at the time.  He had no dysuria and only symptom he was reporting was some myalgias and a change in color appearance.  So wonder if this is actually an asymptomatic bacteriuria.  He has a history of a 2 mm nephrolithiasis on CT scan in 2016, no other history of symptomatic stones, and no symptoms now to suggest a retained stone.  He has no symptoms of bladder outlet obstruction.  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  Currently has no symptoms of dysuria so I do not think we need to retest or do further imaging of the bladder.  I recommended better bladder habits and complete emptying of the bladder promptly whenever it fills.

## 2024-02-14 NOTE — Assessment & Plan Note (Signed)
 Procedure Note: Manual Removal of Impacted Cerumen Using a Curette   Indication:  Cerumen impaction causing symptoms (e.g., hearing loss, pain, tinnitus) or preventing assessment of the ear canal and tympanic membrane.  Procedure:  Explained the procedure to the patient and informed consent was obtained  Review patient history for contraindications (e.g., nonintact tympanic membrane, history of ear surgery, anatomical abnormalities).  After a position of the patient's head upright, I visualized the ear canal and cerumen using an otoscope.  I gently inserted the curette into the ear canal avoiding contact with the canal walls, and carefully scooped the cerumen, removing it in small pieces.  I reassessed the ear canal and tympanic membrane, there was no residual cerumen nor signs of trauma.  Follow-Up:   I instructed the patient to report any persistent symptoms such as pain, discharge, or hearing loss.  Schedule a follow-up appointment if necessary.

## 2024-02-14 NOTE — Assessment & Plan Note (Signed)
 His weight has increased to 246 lbs from 185 lbs, with a sedentary lifestyle contributing to the gain. He has made efforts to eat healthier. Focus on nutrition and calorie control, encouraged healthy meal preparation, and avoid high-calorie foods.

## 2024-02-14 NOTE — Assessment & Plan Note (Signed)
 Stage I hypertension on exam today.  Will check renal function along with urine microalbumin.  I anticipate we may need to start antihypertensive soon, will try lifestyle modification over the next 3 months and follow-up in clinic.

## 2024-02-14 NOTE — Progress Notes (Signed)
 New Patient Office Visit  Subjective    Patient ID: Thomas Moran, male    DOB: [birth year]  Age: 38 y.o. MRN: [id number]  CC:  Chief Complaint  Patient presents with   [facility] F/U for UTI and elevated ALT  Did have thrush as well  Patient did take pain medications for back pain yesterday     HPI  Discussed the use of AI scribe software for clinical note transcription with the patient, who gave verbal consent to proceed.  History of Present Illness Thomas Moran is a 38 year old male who presents for follow-up after being treated for a urinary tract infection and concerns about liver function.  He was diagnosed with a urinary tract infection at urgent care last week and received an antibiotic shot, followed by a prescription for oral antibiotics to be taken twice daily for ten days. He did not experience dysuria but noted cloudy urine with debris. He sometimes delays urination for up to an hour while driving. He also noticed foam in his urine, which he wonders might be related to the infection.  Initially, he experienced symptoms resembling the flu, such as facial warmth, myalgia, and watery eyes, which his wife also had. These symptoms have since resolved, and he feels much better now. He recently had oral discomfort, a white tongue, and an issue on the roof of his mouth, leading him to take over-the-counter medications like aspirin and Nyquil.  He has a history of a right bicep tendon injury from lifting a sofa at work, which required surgical repair. He was previously suspected of having kidney stones, but no treatment was given, and he never passed any stones. 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 He attempts to eat healthier by preparing meals at home and using an air fryer but occasionally resorts to less healthy options while on the  road.  He reports occasional past chest pain associated with stress, but not recently. He experienced dizziness previously, which resolved after earwax removal. He drinks alcohol infrequently, about two drinks when going out, and has significantly reduced his intake from the past. He uses a vape occasionally, about once a month. No current chest pain, abdominal pain, or dysuria. No recent surgeries except for bicep tendon repair.   Outpatient Encounter Medications as of 02/14/2024  Medication Sig   [DISCONTINUED] nystatin  (MYCOSTATIN ) 100000 UNIT/ML suspension Take 5 mLs (500,000 Units total) by mouth 4 (four) times daily.   [DISCONTINUED] sulfamethoxazole -trimethoprim  (BACTRIM  DS) 800-160 MG tablet Take 1 tablet by mouth 2 (two) times daily for 10 days.   [DISCONTINUED] cetirizine (ZYRTEC) 10 MG tablet Take 10 mg by mouth daily.   [DISCONTINUED] oxyCODONE -acetaminophen  (PERCOCET/ROXICET) 5-325 MG per tablet Take 1-2 tablets by mouth every 6 (six) hours as needed.   [DISCONTINUED] oxymetazoline (AFRIN) 0.05 % nasal spray Place 1 spray into both nostrils 2 (two) times daily.   No facility-administered encounter medications on file as of 02/14/2024.    Past Medical History:  Diagnosis Date   Allergy    Kidney stones    Renal disorder     Past Surgical History:  Procedure Laterality Date   arm surgery Right    Tendon repair   COLONOSCOPY      Family History  Problem Relation Age of Onset   Diabetes Mother    Cancer Maternal Grandfather  Arthritis Maternal Grandmother        Objective    BP (!) 135/94   Pulse 93   Ht 5' 4 (1.626 m)   Wt 246 lb (111.6 kg)   BMI 42.23 kg/m   Physical Exam  Gen: Well-appearing man Ears: Right tympanic membrane is normal, left ear is impacted with cerumen, this was removed with a curette, the ear canal was very inflamed, the tympanic membrane mildly erythematous but no perforation, hearing normal Neck: Multiple skin tags, thyroid normal,  no nodules or adenopathy Heart: regular, no murmur Lungs: Unlabored, clear throughout Abd; soft, nontender, no organomegaly, no hernias, moderate striae present, no tenderness of palpation of the bladder Ext: Warm, no edema Neuro: Alert, conversational, full strength in the upper and lower extremities Psych: Appropriate mood and affect, not anxious or depressed appearing     Assessment & Plan:    Problem List Items Addressed This Visit       High   Obesity, Class II, BMI 35-39.9 - Primary (Chronic)   His weight has increased to 246 lbs from 185 lbs, with a sedentary lifestyle contributing to the gain. He has made efforts to eat healthier. Focus on nutrition and calorie control, encouraged healthy meal preparation, and avoid high-calorie foods.      Relevant Orders   Comprehensive metabolic panel with GFR   Lipid panel   Hemoglobin A1c   Hypertension (Chronic)   Stage I hypertension on exam today.  Will check renal function along with urine microalbumin.  I anticipate we may need to start antihypertensive soon, will try lifestyle modification over the next 3 months and follow-up in clinic.      Relevant Orders   Microalbumin / creatinine urine ratio   Abnormal liver function test (Chronic)   Mild degree of hepatocellular pattern elevation in ALT AST.  No significant alcohol use.  Some recent Tylenol  and aspirin use and a recent viral illness may have contributed.  Fib 4 score is low risk for advanced fibrosis, 1.04.  Will recheck liver enzymes today.  Most likely I suspect this is related to metabolic syndrome.  We talked about weight.  Will check viral hepatitis serologies and iron levels to rule out those conditions.      Relevant Orders   Hepatitis C antibody   IBC + Ferritin   Hepatitis B Surface AntiGEN   Hepatitis B Core Antibody, total   Hepatitis B surface antibody,qualitative     Medium    Lower urinary tract symptoms (LUTS)   One episode of an E. coli urinary  tract infection, though should be noted he had pretty minimal symptoms at the time.  He had no dysuria and only symptom he was reporting was some myalgias and a change in color appearance.  So wonder if this is actually an asymptomatic bacteriuria.  He has a history of a 2 mm nephrolithiasis on CT scan in 2016, no other history of symptomatic stones, and no symptoms now to suggest a retained stone.  He has no symptoms of bladder outlet obstruction.  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  Currently has no symptoms of dysuria so I do not think we need to retest or do further imaging of the bladder.  I recommended better bladder habits and complete emptying of the bladder promptly whenever it fills.  Low   Cerumen impaction (Chronic)   Procedure Note: Manual Removal of Impacted Cerumen Using a Curette   Indication:  Cerumen impaction causing symptoms (e.g., hearing loss, pain, tinnitus) or preventing assessment of the ear canal and tympanic membrane.  Procedure:  Explained the procedure to the patient and informed consent was obtained  Review patient history for contraindications (e.g., nonintact tympanic membrane, history of ear surgery, anatomical abnormalities).  After a position of the patient's head upright, I visualized the ear canal and cerumen using an otoscope.  I gently inserted the curette into the ear canal avoiding contact with the canal walls, and carefully scooped the cerumen, removing it in small pieces.  I reassessed the ear canal and tympanic membrane, there was no residual cerumen nor signs of trauma.  Follow-Up:  I instructed the patient to report any persistent symptoms such as pain, discharge, or hearing loss.  Schedule a follow-up appointment if necessary.       Other Visit Diagnoses       Screening for diabetes  mellitus       Relevant Orders   Hemoglobin A1c     Screening for lipid disorders       Relevant Orders   Lipid panel     Encounter for HCV screening test for low risk patient       Relevant Orders   Hepatitis C antibody     Screening for HIV (human immunodeficiency virus)       Relevant Orders   HIV Antibody (routine testing w rflx)       Return in about 3 months (around 05/15/2024).   Cleatus Debby Specking, MD

## 2024-02-15 LAB — IBC + FERRITIN
Ferritin: 214.1 ng/mL (ref 22.0–322.0)
Iron: 125 ug/dL (ref 42–165)
Saturation Ratios: 39.5 % (ref 20.0–50.0)
TIBC: 316.4 ug/dL (ref 250.0–450.0)
Transferrin: 226 mg/dL (ref 212.0–360.0)

## 2024-02-15 LAB — HIV ANTIBODY (ROUTINE TESTING W REFLEX)
HIV 1&2 Ab, 4th Generation: NONREACTIVE
HIV FINAL INTERPRETATION: NEGATIVE

## 2024-02-15 LAB — HEMOGLOBIN A1C: Hgb A1c MFr Bld: 6.8 % — ABNORMAL HIGH (ref 4.6–6.5)

## 2024-02-15 LAB — MICROALBUMIN / CREATININE URINE RATIO
Creatinine,U: 97.7 mg/dL
Microalb Creat Ratio: 39 mg/g — ABNORMAL HIGH (ref 0.0–30.0)
Microalb, Ur: 3.8 mg/dL — ABNORMAL HIGH (ref 0.0–1.9)

## 2024-02-15 LAB — LIPID PANEL
Cholesterol: 220 mg/dL — ABNORMAL HIGH (ref 0–200)
HDL: 30.6 mg/dL — ABNORMAL LOW (ref >39.00)
LDL Cholesterol: 134 mg/dL — ABNORMAL HIGH (ref 0–99)
NonHDL: 189.16
Total CHOL/HDL Ratio: 7
Triglycerides: 278 mg/dL — ABNORMAL HIGH (ref 0.0–149.0)
VLDL: 55.6 mg/dL — ABNORMAL HIGH (ref 0.0–40.0)

## 2024-02-15 LAB — HEPATITIS B CORE ANTIBODY, TOTAL: Hep B Core Total Ab: NONREACTIVE

## 2024-02-15 LAB — HEPATITIS B SURFACE ANTIGEN: Hepatitis B Surface Ag: NONREACTIVE

## 2024-02-15 LAB — HEPATITIS B SURFACE ANTIBODY,QUALITATIVE: Hep B S Ab: REACTIVE — AB

## 2024-02-15 LAB — HEPATITIS C ANTIBODY: Hepatitis C Ab: NONREACTIVE

## 2024-02-16 ENCOUNTER — Ambulatory Visit: Payer: Self-pay | Admitting: Student in an Organized Health Care Education/Training Program

## 2024-02-16 DIAGNOSIS — E1169 Type 2 diabetes mellitus with other specified complication: Secondary | ICD-10-CM | POA: Insufficient documentation

## 2024-02-16 DIAGNOSIS — E119 Type 2 diabetes mellitus without complications: Secondary | ICD-10-CM | POA: Insufficient documentation

## 2024-02-18 LAB — COMPREHENSIVE METABOLIC PANEL WITH GFR
ALT: 109 U/L — ABNORMAL HIGH (ref 0–53)
AST: 73 U/L — ABNORMAL HIGH (ref 0–37)
Albumin: 4.4 g/dL (ref 3.5–5.2)
Alkaline Phosphatase: 87 U/L (ref 39–117)
BUN: 12 mg/dL (ref 6–23)
CO2: 23 meq/L (ref 19–32)
Calcium: 9.5 mg/dL (ref 8.4–10.5)
Chloride: 108 meq/L (ref 96–112)
Creatinine, Ser: 0.92 mg/dL (ref 0.40–1.50)
GFR: 104.6 mL/min (ref >60.00)
Glucose, Bld: 132 mg/dL — ABNORMAL HIGH (ref 70–99)
Potassium: 4 meq/L (ref 3.5–5.1)
Sodium: 139 meq/L — AB (ref 135–145)
Total Bilirubin: 0.3 mg/dL (ref 0.2–1.2)
Total Protein: 7.8 g/dL (ref 6.0–8.3)

## 2024-05-15 ENCOUNTER — Encounter: Payer: Self-pay | Admitting: Student in an Organized Health Care Education/Training Program

## 2024-05-15 ENCOUNTER — Ambulatory Visit (INDEPENDENT_AMBULATORY_CARE_PROVIDER_SITE_OTHER): Payer: Self-pay | Admitting: Student in an Organized Health Care Education/Training Program

## 2024-05-15 VITALS — BP 174/115 | HR 65 | Ht 65.0 in | Wt 239.0 lb

## 2024-05-15 DIAGNOSIS — E119 Type 2 diabetes mellitus without complications: Secondary | ICD-10-CM

## 2024-05-15 DIAGNOSIS — E66812 Obesity, class 2: Secondary | ICD-10-CM

## 2024-05-15 DIAGNOSIS — R3989 Other symptoms and signs involving the genitourinary system: Secondary | ICD-10-CM | POA: Diagnosis not present

## 2024-05-15 DIAGNOSIS — E1169 Type 2 diabetes mellitus with other specified complication: Secondary | ICD-10-CM

## 2024-05-15 DIAGNOSIS — I10 Essential (primary) hypertension: Secondary | ICD-10-CM | POA: Diagnosis not present

## 2024-05-15 DIAGNOSIS — E785 Hyperlipidemia, unspecified: Secondary | ICD-10-CM | POA: Diagnosis not present

## 2024-05-15 LAB — URINALYSIS, ROUTINE W REFLEX MICROSCOPIC
Bilirubin Urine: NEGATIVE
Ketones, ur: NEGATIVE
Nitrite: NEGATIVE
Specific Gravity, Urine: 1.01 (ref 1.000–1.030)
Total Protein, Urine: NEGATIVE
Urine Glucose: NEGATIVE
Urobilinogen, UA: 0.2 (ref 0.0–1.0)
pH: 6 (ref 5.0–8.0)

## 2024-05-15 LAB — POCT GLYCOSYLATED HEMOGLOBIN (HGB A1C): Hemoglobin A1C: 5.4 % (ref 4.0–5.6)

## 2024-05-15 NOTE — Assessment & Plan Note (Signed)
 He experiences intermittent cloudy urine with debris and air post-voiding. The differential diagnosis includes a bladder infection or bladder-colon fistula, though there are no current signs of infection like dysuria. A urine culture has been obtained and a CT scan of the bladder ordered. Referral for cystoscopy will be considered if symptoms persist or the CT scan shows abnormalities.

## 2024-05-15 NOTE — Assessment & Plan Note (Signed)
 His weight has decreased from 246 lbs to 239 lbs. He is actively making dietary changes and increasing physical activity. Weight loss medications were discussed but deferred due to his progress and preference to avoid medications. He will continue his dietary and exercise regimen, with weight management strategies reassessed in future visits.

## 2024-05-15 NOTE — Patient Instructions (Signed)
°  VISIT SUMMARY: During your visit, we discussed your urinary issues, diabetes, high blood pressure, high cholesterol, and weight management. We reviewed your symptoms, current management strategies, and potential future treatments.  YOUR PLAN: -LOWER URINARY TRACT SYMPTOMS: You have been experiencing cloudy urine with debris and a sensation of air passage during urination. This could be due to a bladder infection or a connection between the bladder and colon. We have taken a urine culture and ordered a CT scan of your bladder. If needed, we will refer you for a cystoscopy to further investigate.  -TYPE 2 DIABETES MELLITUS: Your blood sugar levels have improved, and you are managing well with dietary changes. We discussed medication options but decided to continue with your current plan since you are doing well. We will reassess your need for medication in future visits.  -HYPERTENSION: We talked about starting medication to manage your high blood pressure to reduce your risk of heart problems. You are hesitant, but you understand the importance of managing your blood pressure. We will continue to discuss this in future visits.  -OBESITY, CLASS II: You have lost weight and are making good progress with dietary changes and increased physical activity. We discussed weight loss medications but decided to continue with your current plan. We will reassess your weight management strategies in future visits.  -HYPERLIPIDEMIA: We discussed starting medication to manage your high cholesterol to reduce your risk of heart problems. You are hesitant, but you understand the importance of managing your cholesterol. We will continue to discuss this in future visits.  INSTRUCTIONS: We have taken a urine culture and ordered a CT scan of your bladder. Please follow up with us  once the results are available. If your symptoms persist or the CT scan shows abnormalities, we may refer you for a cystoscopy. Continue with  your dietary changes and weight management strategies, and we will reassess your need for medications for diabetes, high blood pressure, and high cholesterol in future visits.

## 2024-05-15 NOTE — Assessment & Plan Note (Signed)
 Chronic severe asymptomatic hypertension.  I recommended initiating antihypertensive medication.  I recommended starting with combination Hyzaar.  Patient declined medications.  He wants more time to work on lifestyle modifications.  He is frustrated that he is at a point to require medications.  We talked about the risks of untreated hypertension.  Follow-up with me in 3 months, I urged him to consider adding medications over that time.

## 2024-05-15 NOTE — Assessment & Plan Note (Signed)
 Cholesterol-lowering medication was discussed to reduce cardiovascular risk. He is hesitant but understands the importance of managing cholesterol.  I offered him rosuvastatin, we talked about the benefits.  He declined starting that right now.  I think he needs more time to adjust to the reality that he is at a point where medications will benefit him in the future.

## 2024-05-15 NOTE — Progress Notes (Signed)
 Established Patient Office Visit  Patient ID: Thomas Moran, male    DOB: 11/04/1985  Age: 38 y.o. MRN: [id number] PCP: Jerrell Cleatus Ned, MD  Chief Complaint  Patient presents with   Medical Management of Chronic Issues    3 month follow up  Patient is having cloudy urine when urinating and after urinating its like air coming out  Patient declined all Health maintenance     Subjective:     HPI  Discussed the use of AI scribe software for clinical note transcription with the patient, who gave verbal consent to proceed.  History of Present Illness Thomas Moran is a 37 year old male who presents with urinary issues, including cloudy urine and air passage during urination.  He has been experiencing urinary issues characterized by cloudy urine, occasional debris, and a sensation of air passage during urination, occurring approximately once a week. These symptoms have been intermittent for several months without associated burning sensation, pain, or fever. He recalls a single episode of brown urine, which he attributed to consuming cold medicine, but it has not recurred.  He recalls being treated in the emergency room with 'antivirus' medication for a urinary infection.  He has a history of early diabetes, high blood pressure, and high cholesterol. He is actively managing these conditions through dietary changes, including reducing sugar intake and increasing water consumption. He reports a weight loss of seven pounds over the past three months, from 246 to 239 pounds.  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 He attempts to walk when possible during his stops.  No abdominal pain, diarrhea, or significant changes in appetite, except for occasional discomfort after consuming spicy foods.     Objective:     BP (!) 174/115   Pulse 65   Ht 5' 5 (1.651 m)   Wt 239 lb (108.4  kg)   SpO2 100%   BMI 39.77 kg/m   Physical Exam  Gen: Well-appearing man Neck: Normal thyroid, no nodules or adenopathy Heart: Regular, no murmur Lungs: Unlabored, clear throughout Abd: Obese, soft, nontender, no organomegaly   Results for orders placed or performed in visit on 05/15/24  POCT glycosylated hemoglobin (Hb A1C)  Result Value Ref Range   Hemoglobin A1C 5.4 4.0 - 5.6 %   HbA1c POC (<> result, manual entry)     HbA1c, POC (prediabetic range)     HbA1c, POC (controlled diabetic range)        Assessment & Plan:   Problem List Items Addressed This Visit       High   Obesity, Class II, BMI 35-39.9 (Chronic)   His weight has decreased from 246 lbs to 239 lbs. He is actively making dietary changes and increasing physical activity. Weight loss medications were discussed but deferred due to his progress and preference to avoid medications. He will continue his dietary and exercise regimen, with weight management strategies reassessed in future visits.      Hypertension (Chronic)   Chronic severe asymptomatic hypertension.  I recommended initiating antihypertensive medication.  I recommended starting with combination Hyzaar.  Patient declined medications.  He wants more time to work on lifestyle modifications.  He is frustrated that he is at a point to require medications.  We talked about the risks of untreated hypertension.  Follow-up with me in 3 months, I urged him to consider adding medications over that time.  Diabetes mellitus without complication (HCC) - Primary (Chronic)   His blood sugar has improved from 6.8% to 5.4. Medication options were discussed but deferred due to good control and his preference to avoid medications. The risks of uncontrolled diabetes were explained. He will continue dietary changes and weight management, with medication needs reassessed in future visits.      Relevant Orders   POCT glycosylated hemoglobin (Hb A1C) (Completed)      Medium    Pneumaturia (Chronic)   He experiences intermittent cloudy urine with debris and air post-voiding. The differential diagnosis includes a bladder infection or bladder-colon fistula, though there are no current signs of infection like dysuria. A urine culture has been obtained and a CT scan of the bladder ordered. Referral for cystoscopy will be considered if symptoms persist or the CT scan shows abnormalities.      Relevant Orders   Urinalysis, Routine w reflex microscopic   Urine Culture   CT ABDOMEN PELVIS W CONTRAST   Hyperlipidemia associated with type 2 diabetes mellitus (HCC) (Chronic)   Cholesterol-lowering medication was discussed to reduce cardiovascular risk. He is hesitant but understands the importance of managing cholesterol.  I offered him rosuvastatin, we talked about the benefits.  He declined starting that right now.  I think he needs more time to adjust to the reality that he is at a point where medications will benefit him in the future.       Return in about 3 months (around 08/13/2024).    Cleatus Debby Specking, MD [facility]

## 2024-05-15 NOTE — Assessment & Plan Note (Signed)
 His blood sugar has improved from 6.8% to 5.4. Medication options were discussed but deferred due to good control and his preference to avoid medications. The risks of uncontrolled diabetes were explained. He will continue dietary changes and weight management, with medication needs reassessed in future visits.

## 2024-05-17 ENCOUNTER — Ambulatory Visit: Payer: Self-pay | Admitting: Student in an Organized Health Care Education/Training Program

## 2024-05-17 DIAGNOSIS — N321 Vesicointestinal fistula: Secondary | ICD-10-CM

## 2024-05-17 LAB — URINE CULTURE
MICRO NUMBER:: 17358121
Result:: NO GROWTH
SPECIMEN QUALITY:: ADEQUATE

## 2024-05-17 NOTE — Addendum Note (Signed)
 Addended by: JERRELL SOLIAN T on: 05/17/2024 11:32 AM   Modules accepted: Orders

## 2024-06-02 ENCOUNTER — Ambulatory Visit
Admission: RE | Admit: 2024-06-02 | Discharge: 2024-06-02 | Disposition: A | Discharge status: Home / Self Care | Source: Ambulatory Visit | Attending: Student in an Organized Health Care Education/Training Program | Admitting: Student in an Organized Health Care Education/Training Program

## 2024-06-02 DIAGNOSIS — R3989 Other symptoms and signs involving the genitourinary system: Secondary | ICD-10-CM

## 2024-06-02 MED ORDER — IOPAMIDOL (ISOVUE-370) INJECTION 76%
80.0000 mL | Freq: Once | INTRAVENOUS | Status: AC | PRN
  Administered 2024-06-02: 80 mL via INTRAVENOUS

## 2024-08-14 ENCOUNTER — Ambulatory Visit: Admitting: Student in an Organized Health Care Education/Training Program
